# Patient Record
Sex: Male | Born: 1935 | Race: Black or African American | Hispanic: No | Marital: Single
Health system: Southern US, Community
[De-identification: ages and names within clinical notes are randomized; demographics above are authoritative.]

## PROBLEM LIST (undated history)

## (undated) DIAGNOSIS — M1990 Unspecified osteoarthritis, unspecified site: Secondary | ICD-10-CM

## (undated) DIAGNOSIS — I442 Atrioventricular block, complete: Secondary | ICD-10-CM

## (undated) DIAGNOSIS — I10 Essential (primary) hypertension: Secondary | ICD-10-CM

## (undated) DIAGNOSIS — I5032 Chronic diastolic (congestive) heart failure: Secondary | ICD-10-CM

## (undated) DIAGNOSIS — I259 Chronic ischemic heart disease, unspecified: Secondary | ICD-10-CM

## (undated) DIAGNOSIS — C61 Malignant neoplasm of prostate: Secondary | ICD-10-CM

## (undated) DIAGNOSIS — H547 Unspecified visual loss: Secondary | ICD-10-CM

## (undated) DIAGNOSIS — E119 Type 2 diabetes mellitus without complications: Secondary | ICD-10-CM

## (undated) HISTORY — DX: Chronic diastolic (congestive) heart failure: I50.32

## (undated) HISTORY — DX: Chronic ischemic heart disease, unspecified: I25.9

## (undated) HISTORY — DX: Unspecified visual loss: H54.7

## (undated) HISTORY — DX: Malignant neoplasm of prostate: C61

## (undated) HISTORY — DX: Atrioventricular block, complete: I44.2

## (undated) HISTORY — DX: Type 2 diabetes mellitus without complications: E11.9

## (undated) HISTORY — DX: Unspecified osteoarthritis, unspecified site: M19.90

## (undated) HISTORY — DX: Essential (primary) hypertension: I10

---

## 2000-03-18 ENCOUNTER — Encounter: Payer: Self-pay | Admitting: Emergency Medicine

## 2000-03-18 ENCOUNTER — Inpatient Hospital Stay (HOSPITAL_COMMUNITY): Admission: EM | Admit: 2000-03-18 | Discharge: 2000-03-22 | Payer: Self-pay | Admitting: Emergency Medicine

## 2000-03-18 ENCOUNTER — Encounter (INDEPENDENT_AMBULATORY_CARE_PROVIDER_SITE_OTHER): Payer: Self-pay | Admitting: *Deleted

## 2000-03-19 ENCOUNTER — Encounter: Payer: Self-pay | Admitting: Internal Medicine

## 2000-03-21 ENCOUNTER — Encounter: Payer: Self-pay | Admitting: Internal Medicine

## 2000-03-26 ENCOUNTER — Encounter: Payer: Self-pay | Admitting: Emergency Medicine

## 2000-03-26 ENCOUNTER — Emergency Department (HOSPITAL_COMMUNITY): Admission: EM | Admit: 2000-03-26 | Discharge: 2000-03-26 | Payer: Self-pay | Admitting: *Deleted

## 2000-11-27 ENCOUNTER — Emergency Department (HOSPITAL_COMMUNITY): Admission: EM | Admit: 2000-11-27 | Discharge: 2000-11-27 | Payer: Self-pay | Admitting: Emergency Medicine

## 2003-03-30 ENCOUNTER — Inpatient Hospital Stay (HOSPITAL_COMMUNITY): Admission: EM | Admit: 2003-03-30 | Discharge: 2003-04-15 | Payer: Self-pay | Admitting: Emergency Medicine

## 2003-03-30 ENCOUNTER — Encounter: Payer: Self-pay | Admitting: Emergency Medicine

## 2004-05-24 HISTORY — PX: PACEMAKER INSERTION: SHX728

## 2004-06-17 ENCOUNTER — Inpatient Hospital Stay (HOSPITAL_COMMUNITY): Admission: AD | Admit: 2004-06-17 | Discharge: 2004-06-19 | Payer: Self-pay | Admitting: Cardiology

## 2004-06-18 ENCOUNTER — Encounter: Payer: Self-pay | Admitting: Cardiology

## 2004-10-25 ENCOUNTER — Ambulatory Visit: Payer: Self-pay | Admitting: Internal Medicine

## 2005-09-10 ENCOUNTER — Ambulatory Visit: Payer: Self-pay | Admitting: Internal Medicine

## 2006-01-07 IMAGING — CR DG CHEST 2V
2 series · 2 of 2 positions shown · non-contrast
Comparison: 06/18/04.

CLINICAL DATA: Complete heart block ? post pacer insertion. 
 TWO VIEW CHEST

[view not recorded (1 of 2)]
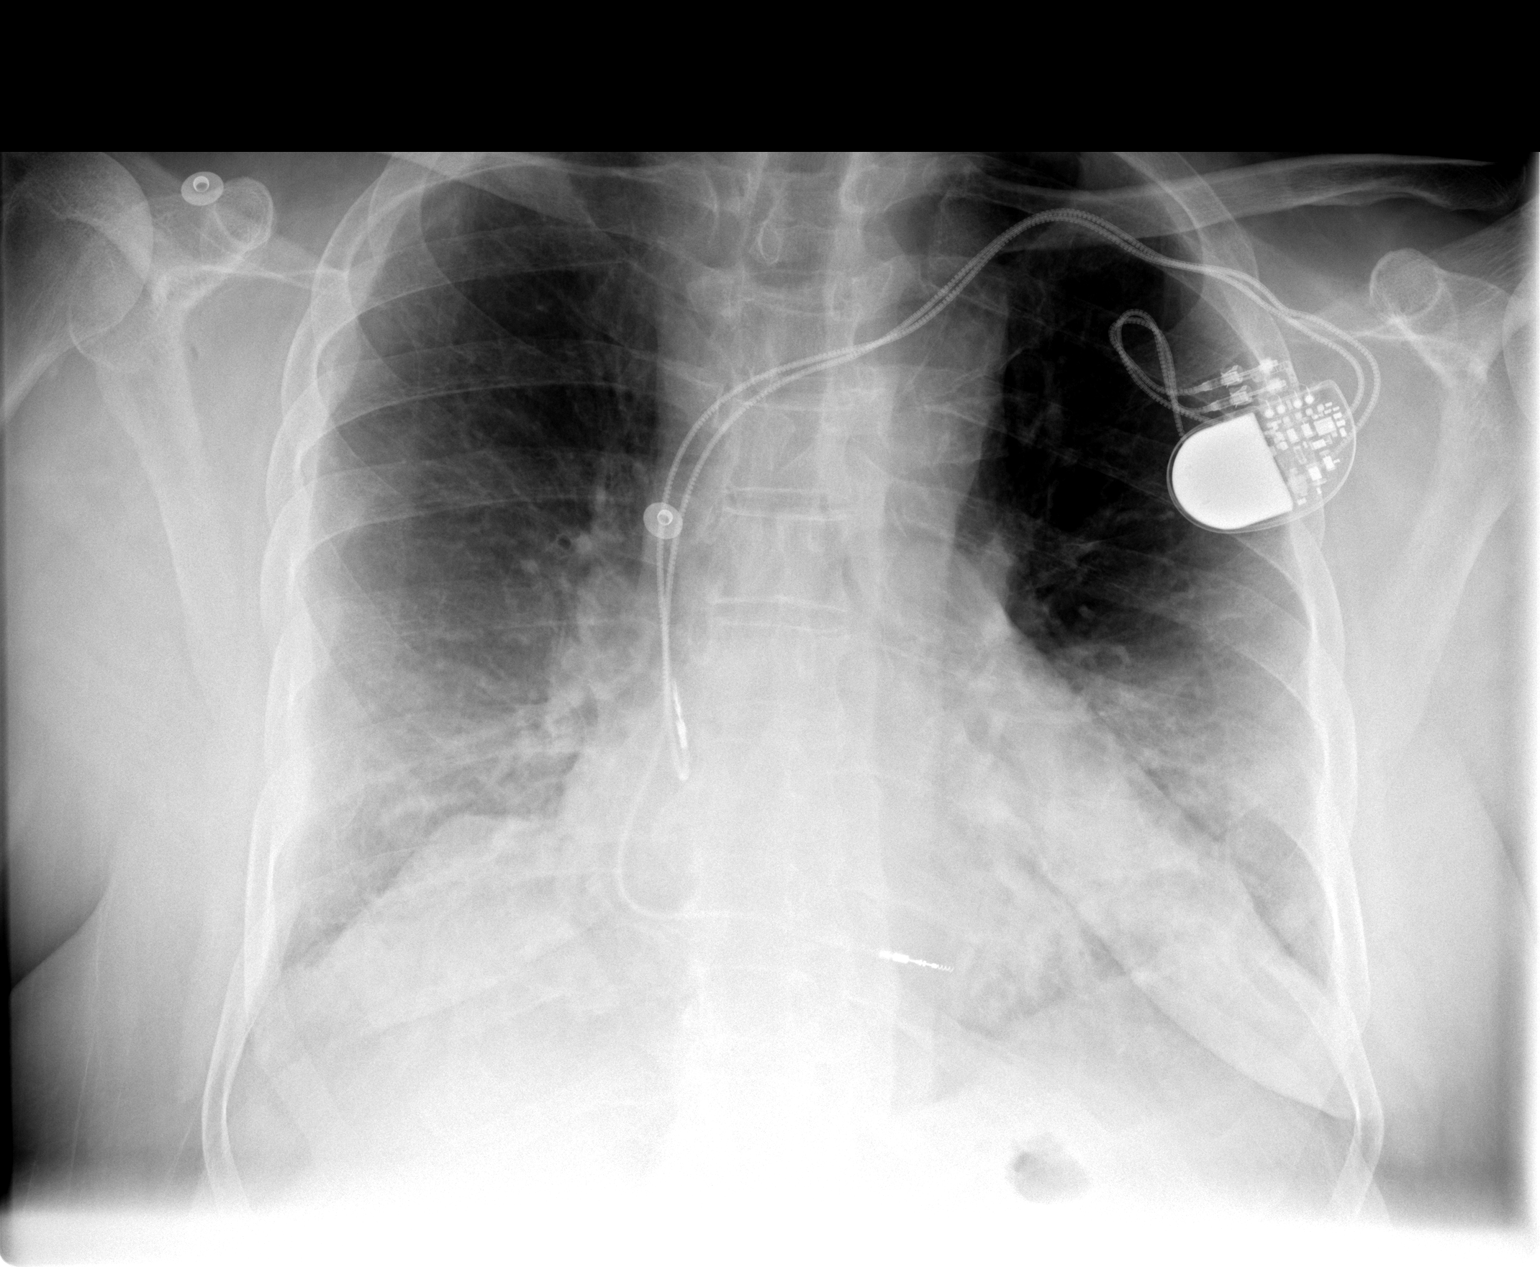

[view not recorded (2 of 2)]
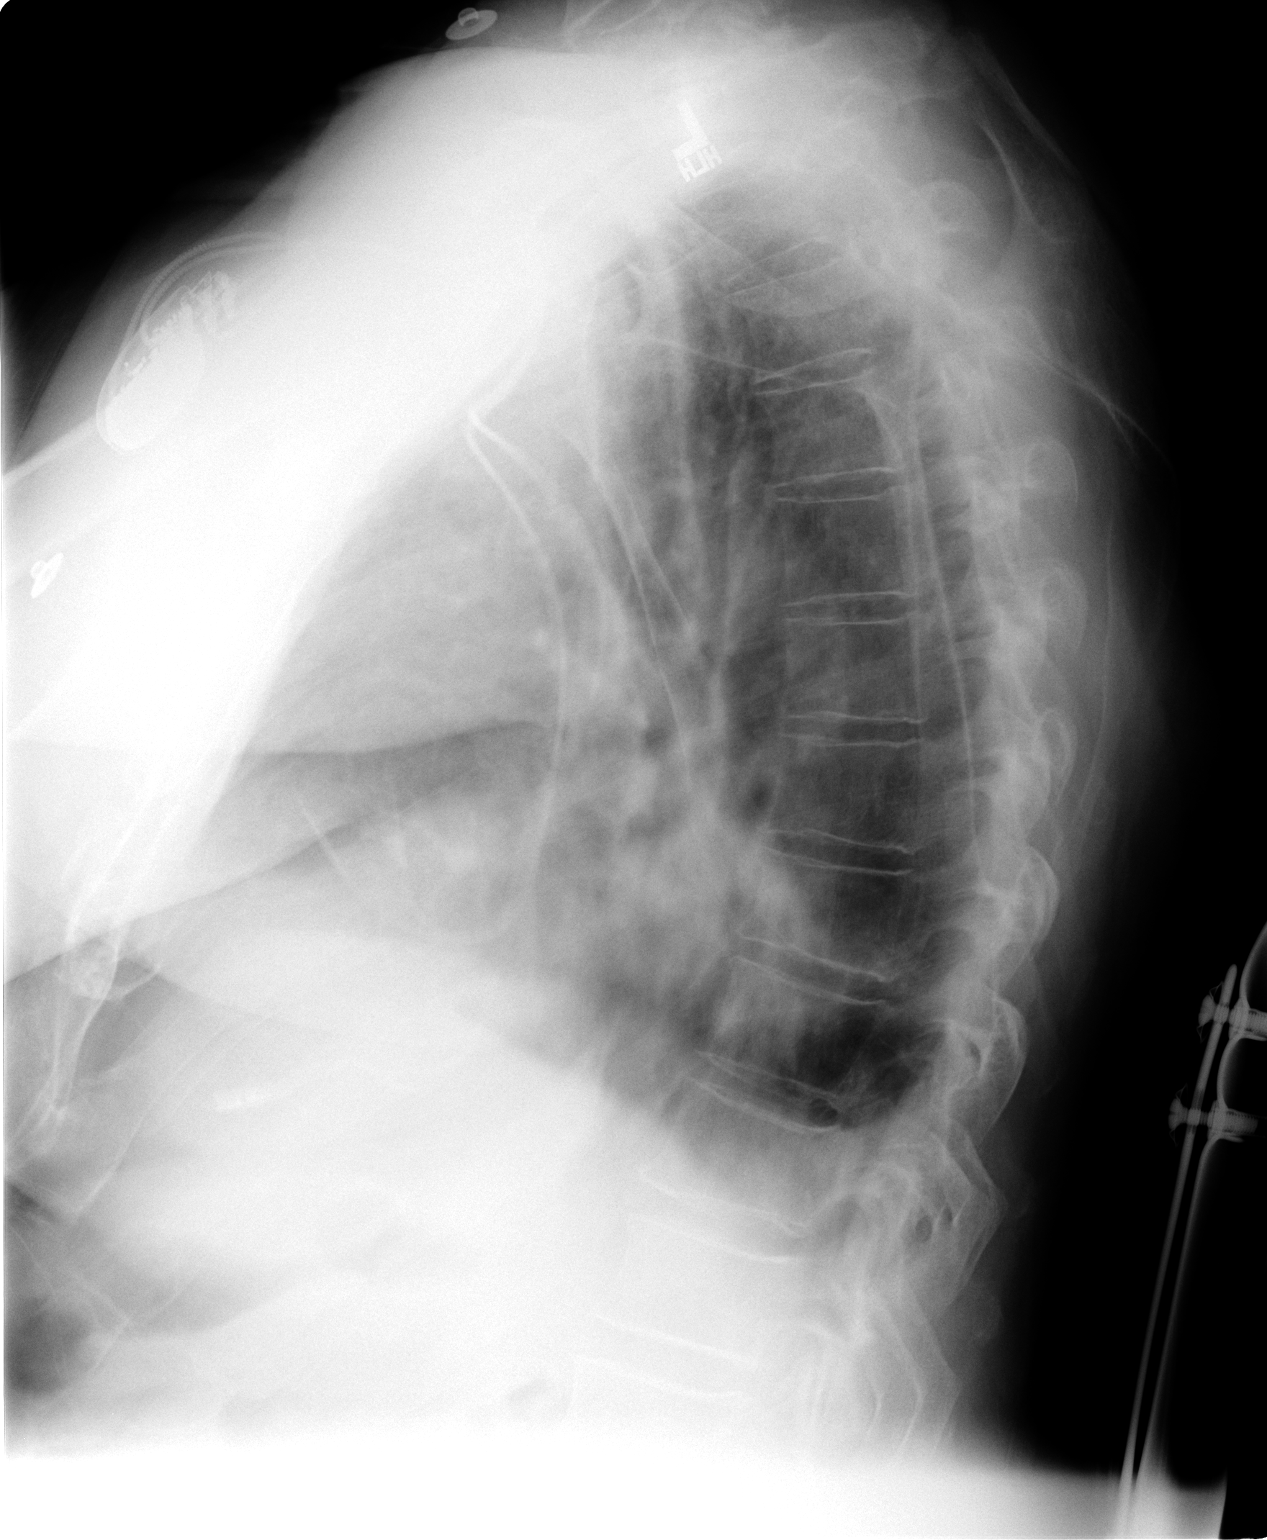

[2 of 2 positions shown; findings below may reference images not displayed]

Status-post placement of a dual lead permanent cardiac pacer.  There is marked breathing motion artifact on the lateral view.  No pneumothorax or heart failure. 
 IMPRESSION
 Status-post placement of dual lead pacer.  No pneumothorax. No definite active process ? breathing motion artifact on the left.

## 2006-04-10 ENCOUNTER — Ambulatory Visit: Payer: Self-pay | Admitting: Internal Medicine

## 2006-11-06 ENCOUNTER — Ambulatory Visit: Payer: Self-pay | Admitting: Internal Medicine

## 2007-04-28 ENCOUNTER — Ambulatory Visit: Payer: Self-pay | Admitting: Internal Medicine

## 2007-11-05 ENCOUNTER — Ambulatory Visit: Payer: Self-pay | Admitting: Internal Medicine

## 2008-05-24 ENCOUNTER — Ambulatory Visit: Payer: Self-pay | Admitting: Internal Medicine

## 2008-11-02 ENCOUNTER — Encounter: Payer: Self-pay | Admitting: Internal Medicine

## 2008-11-21 ENCOUNTER — Ambulatory Visit: Payer: Self-pay | Admitting: Internal Medicine

## 2009-07-11 ENCOUNTER — Ambulatory Visit: Payer: Self-pay | Admitting: Internal Medicine

## 2009-07-11 DIAGNOSIS — I10 Essential (primary) hypertension: Secondary | ICD-10-CM

## 2009-07-11 DIAGNOSIS — Z950 Presence of cardiac pacemaker: Secondary | ICD-10-CM | POA: Insufficient documentation

## 2010-03-05 ENCOUNTER — Encounter: Payer: Self-pay | Admitting: Internal Medicine

## 2010-03-05 ENCOUNTER — Ambulatory Visit: Payer: Self-pay

## 2010-09-25 ENCOUNTER — Ambulatory Visit: Admit: 2010-09-25 | Payer: Self-pay | Admitting: Internal Medicine

## 2010-10-23 NOTE — Cardiovascular Report (Signed)
Summary: Office Visit   Office Visit   Imported By: Roderic Ovens 03/22/2010 12:43:04  _____________________________________________________________________  External Attachment:    Type:   Image     Comment:   External Document

## 2010-10-23 NOTE — Miscellaneous (Signed)
Summary: [facility] Admission Record  [facility] Admission Record   Imported By: Roderic Ovens 10/04/2009 12:35:46  _____________________________________________________________________  External Attachment:    Type:   Image     Comment:   External Document

## 2010-10-23 NOTE — Assessment & Plan Note (Signed)
Summary: pacer check/medtronic   Visit Type:  Pacemaker check Primary Provider:  pt in [facility] rehab center   History of Present Illness: Mr. Tricarico returns today for followup.  He is an elderly, demented man with a h/o symptomatic bradycardia, CHB, and HTN.  He is s/p PPM implant.  He denies c/p or sob.  He has mild peripheral edema.  Current Medications (verified): 1)  Colace 100 Mg Caps (Docusate Sodium) .Marland Kitchen.. 1 Once Daily 2)  Hydroxyzine Hcl 25 Mg Tabs (Hydroxyzine Hcl) .Marland Kitchen.. 1 Tab Q 6 Hours As Needes For Itching 3)  Lisinopril 2.5 Mg Tabs (Lisinopril) .... Take One Tablet By Mouth Daily 4)  Nitrostat 0.4 Mg Subl (Nitroglycerin) .Marland Kitchen.. 1 Tablet Under Tongue At Onset of Chest Pain; You May Repeat Every 5 Minutes For Up To 3 Doses. 5)  Oystershell Calicum .... 2 Tabs Two Times A Day 6)  Phenobarbital 60 Mg Tabs (Phenobarbital) .Marland Kitchen.. 1 Tab Two Times A Day 7)  Tandem Plus .Marland KitchenMarland Kitchen. 1 Capsule Two Times A Day 8)  Therapeutic-M .... 1 Tab Once Daily 9)  Eucerin Lotion .... As Needed 10)  Alprazolam 0.5 Mg Tabs (Alprazolam) .Marland Kitchen.. 1 Tab Q 6 Hours As Directed 11)  Mirtazapine 15 Mg Tabs (Mirtazapine) .Marland Kitchen.. 1 Tab Qhs 12)  Metformin Hcl 500 Mg Tabs (Metformin Hcl) .Marland Kitchen.. 1 Tab Once Daily 13)  Genebes (Apap) 350mg  .... 2 Tabs As Needed For Pain 14)  Oxycodone-Acetaminophen 5-325 Mg Tabs (Oxycodone-Acetaminophen) .Marland Kitchen.. 1 Tab Q 6 Hours As Needed For Pain 15)  Aspirin 81 Mg Tbec (Aspirin) .... Take One Tablet By Mouth Daily 16)  Bicalutamide 50 Mg Tabs (Bicalutamide) .Marland Kitchen.. 1 Tab Once Daily  Allergies (verified): No Known Drug Allergies  Past History:  Past Medical History: Last updated: 10/21/2008 Complete heart block Diastolic CHF  HTN  Past Surgical History: Last updated: 10/21/2008 S/P PPM 05/2004  Vital Signs:  Patient profile:   77 year old male BP sitting:   117 / 70  (left arm) Cuff size:   large  Vitals Entered By: Burnett Kanaris, CNA (July 11, 2009 3:33 PM)  Physical  Exam  General:  Well developed, well nourished, in no acute distress.  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 Neck: supple. No JVD. Carotids 2+ bilaterally no bruits Cor: RRR no rubs, gallops or murmur Lungs: CTA Ab: soft, nontender. nondistended. No HSM. Good bowel sounds Ext: warm. no cyanosis, clubbing. 1+ edema Neuro: alert and oriented. Grossly nonfocal. affect pleasant    PPM Specifications Following MD:  Lewayne Bunting, MD     PPM Vendor:  Medtronic     PPM Model Number:  303B     PPM Serial Number:  [id number] PPM DOI:  06/18/2004      Lead 1    Location: RA     DOI: 06/18/2004     Model #: [id number]     Serial #: [id number]     Status: active Lead 2    Location: RV     DOI: 06/18/2004     Model #: [id number]     Serial #: [id number]     Status: active  Magnet Response Rate:  BOL 85 ERI  65  Indications:  CHB  Explantation Comments:  Pacemaker dependent  PPM Follow Up Remote Check?  No Battery Voltage:  2.78 V     Battery Est. Longevity:  5 years     Pacer Dependent:  Yes       PPM Device Measurements Atrium  Amplitude: 2.8 mV, Impedance: 468 ohms, Threshold: 0.5 V at 0.4 msec Right Ventricle  Impedance: 456 ohms, Threshold: 0.5 V at 0.4 msec  Episodes MS Episodes:  31     Percent Mode Switch:  0%     Ventricular High Rate:  6     Atrial Pacing:  0.3%     Ventricular Pacing:  99.9%  Parameters Mode:  DDD     Lower Rate Limit:  60     Upper Rate Limit:  120 Paced AV Delay:  150     Sensed AV Delay:  120 Next Cardiology Appt Due:  12/22/2009 Tech Comments:  No  parameter changes.  Device function normal.   ROV 6 months clinic. Altha Harm, LPN  July 11, 2009 3:52 PM  MD Comments:  Agree with above.  Impression & Recommendations:  Problem # 1:  CARDIAC PACEMAKER IN SITU (ICD-V45.01) His device is working normally.  Will recheck in one year.  Problem # 2:  ESSENTIAL HYPERTENSION, BENIGN (ICD-401.1) His blood pressure is reasonably well controlled. His updated medication  list for this problem includes:    Lisinopril 2.5 Mg Tabs (Lisinopril) .Marland Kitchen... Take one tablet by mouth daily    Aspirin 81 Mg Tbec (Aspirin) .Marland Kitchen... Take one tablet by mouth daily  Patient Instructions: 1)  Your physician recommends that you schedule a follow-up appointment in: 6 months with device clinic and 12 months with Dr Ladona Ridgel

## 2010-10-23 NOTE — Procedures (Signed)
Summary: rov/jml   Current Medications (verified): 1)  Colace 100 Mg Caps (Docusate Sodium) .Marland Kitchen.. 1 Once Daily 2)  Hydroxyzine Hcl 25 Mg Tabs (Hydroxyzine Hcl) .Marland Kitchen.. 1 Tab Q 6 Hours As Needes For Itching 3)  Lisinopril 2.5 Mg Tabs (Lisinopril) .... Take One Tablet By Mouth Daily 4)  Nitrostat 0.4 Mg Subl (Nitroglycerin) .Marland Kitchen.. 1 Tablet Under Tongue At Onset of Chest Pain; You May Repeat Every 5 Minutes For Up To 3 Doses. 5)  Oystershell Calicum .... 2 Tabs Two Times A Day 6)  Phenobarbital 60 Mg Tabs (Phenobarbital) .Marland Kitchen.. 1 Tab Two Times A Day 7)  Tandem Plus .Marland KitchenMarland Kitchen. 1 Capsule Two Times A Day 8)  Therapeutic-M .... 1 Tab Once Daily 9)  Eucerin Lotion .... As Needed 10)  Alprazolam 0.5 Mg Tabs (Alprazolam) .Marland Kitchen.. 1 Tab Q 6 Hours As Directed 11)  Mirtazapine 15 Mg Tabs (Mirtazapine) .... 1/2 By Mouth At Bedtime 12)  Metformin Hcl 500 Mg Tabs (Metformin Hcl) .Marland Kitchen.. 1 Tab Once Daily 13)  Genebes (Apap) 350mg  .... 2 Tabs As Needed For Pain 14)  Oxycodone-Acetaminophen 5-325 Mg Tabs (Oxycodone-Acetaminophen) .Marland Kitchen.. 1 Tab Q 6 Hours As Needed For Pain 15)  Aspirin 81 Mg Tbec (Aspirin) .... Take One Tablet By Mouth Daily 16)  Bicalutamide 50 Mg Tabs (Bicalutamide) .Marland Kitchen.. 1 Tab Once Daily  Allergies (verified): No Known Drug Allergies  PPM Specifications Following MD:  Lewayne Bunting, MD     PPM Vendor:  Medtronic     PPM Model Number:  303B     PPM Serial Number:  [id number] PPM DOI:  06/18/2004      Lead 1    Location: RA     DOI: 06/18/2004     Model #: [id number]     Serial #: [id number]     Status: active Lead 2    Location: RV     DOI: 06/18/2004     Model #: 8469     Serial #: [id number]     Status: active  Magnet Response Rate:  BOL 85 ERI  65  Indications:  CHB  Explantation Comments:  Pacemaker dependent  PPM Follow Up Remote Check?  No Battery Voltage:  2.77 V     Battery Est. Longevity:  4 years     Pacer Dependent:  Yes       PPM Device Measurements Atrium  Amplitude: 4.0 mV, Impedance: 471  ohms, Threshold: 1.0 V at 0.4 msec Right Ventricle  Impedance: 454 ohms, Threshold: 0.5 V at 0.4 msec  Episodes MS Episodes:  16     Percent Mode Switch:  0%     Coumadin:  No Ventricular High Rate:  4     Atrial Pacing:  1.9%     Ventricular Pacing:  100%  Parameters Mode:  DDD     Lower Rate Limit:  60     Upper Rate Limit:  120 Paced AV Delay:  150     Sensed AV Delay:  120 Tech Comments:  No parameter changes. Device function normal.  4 VHR episodes 2-3 seconds long.  The longest mode switch was 1:01 minutes, - coumadin.  ROV 6 months with Dr. Graciela Husbands in [place]. Altha Harm, LPN  March 05, 2010 12:27 PM

## 2010-11-16 ENCOUNTER — Encounter: Payer: Self-pay | Admitting: Internal Medicine

## 2010-11-16 ENCOUNTER — Encounter (INDEPENDENT_AMBULATORY_CARE_PROVIDER_SITE_OTHER): Payer: Medicare Other | Admitting: Internal Medicine

## 2010-11-16 DIAGNOSIS — I2589 Other forms of chronic ischemic heart disease: Secondary | ICD-10-CM

## 2010-11-16 DIAGNOSIS — Z950 Presence of cardiac pacemaker: Secondary | ICD-10-CM

## 2010-11-16 DIAGNOSIS — I10 Essential (primary) hypertension: Secondary | ICD-10-CM

## 2010-11-16 DIAGNOSIS — I442 Atrioventricular block, complete: Secondary | ICD-10-CM

## 2010-11-29 NOTE — Cardiovascular Report (Signed)
Summary: Office Visit   Office Visit   Imported By: Roderic Ovens 11/22/2010 15:58:42  _____________________________________________________________________  External Attachment:    Type:   Image     Comment:   External Document

## 2010-11-29 NOTE — Assessment & Plan Note (Signed)
Summary: pc2/glc/glc.Marland KitchenMarland KitchenPT [facility]LG   Allergies: No Known Drug Allergies   PPM Specifications Following MD:  Lewayne Bunting, MD     PPM Vendor:  Medtronic     PPM Model Number:  303B     PPM Serial Number:  [id number] H PPM DOI:  06/18/2004      Lead 1    Location: RA     DOI: 06/18/2004     Model #: 6962     Serial #: [id number]     Status: active Lead 2    Location: RV     DOI: 06/18/2004     Model #: 4010     Serial #: [id number]     Status: active  Magnet Response Rate:  BOL 85 ERI  65  Indications:  CHB  Explantation Comments:  Pacemaker dependent  PPM Follow Up Pacer Dependent:  Yes      Episodes Coumadin:  No  Parameters Mode:  DDD     Lower Rate Limit:  60     Upper Rate Limit:  120 Paced AV Delay:  150     Sensed AV Delay:  120

## 2010-12-14 NOTE — Assessment & Plan Note (Signed)
[facility]                       [place] CARDIOLOGY OFFICE NOTE  Andrew Quinn, Andrew Quinn                      MRN:          [id number] DATE:11/16/2010                            DOB:          1930-03-15   Andrew Quinn is seen in follow-up for pacemaker implanted for complete heart block by Dr. Ladona Ridgel. He has no complaints.  PAST MEDICAL HISTORY:  Past medical history is legion and includes a poorly clarified problem list describing the following. 1. Diabetes. 2. Hypertension. 3. Coronary artery disease. 4. Diastolic heart failure. 5. Prostate cancer. 6. Arthritis. 7. Blindness.  MEDICATIONS:  Pentobarbital, Remeron, metformin, lisinopril.  EXAMINATION:  VITAL SIGNS:  Blood pressure 139/86, pulse is 81. GENERAL:  He was in a wheelchair. LUNGS:  Clear. HEART:  Sounds are regular. EXTREMITIES:  With brawny edema.  Interrogation of his Medtronic Sigma pulse generator demonstrates a battery voltage of 2.77 with an estimated longevity of 3.5. Impedance: A-A was 47-V was 459, amplitude-A was greater than 2.8. HE IS DEPENDENT.  Heart rate excursion was much broader than I would anticipated for him being in a wheelchair.  I did not ask him whether he was ambulatory or not.  IMPRESSION: 1. Complete heart block. 2. Status post pacer for the above. 3. Ischemic heart disease. 4. Hypertension.  Andrew Quinn is relatively stable.  His device is functioning normally. He has no significant symptoms of ischemic heart disease.  His blood pressure is adequately controlled.  Will see him again in 6 months.   Duke Salvia, MD, [facility]   SCK/MedQ  DD: 11/16/2010  DT: 11/17/2010  Job #: [id number]

## 2011-01-12 ENCOUNTER — Encounter: Payer: Self-pay | Admitting: Internal Medicine

## 2011-02-05 NOTE — Assessment & Plan Note (Signed)
[facility]                         ELECTROPHYSIOLOGY OFFICE NOTE   Quinn, Andrew                      MRN:          [id number]  DATE:04/28/2007                            DOB:          01-Jan-1930    Andrew Quinn returns today for followup.  He is a very pleasant man with a  history of symptomatic complete heart block, status post pacemaker  insertion.  He has a history of ischemic heart disease with preserved LV  function and diabetes.  He returns today for followup.  He denies chest  pain.  He does have a very mild discomfort at his pacemaker insertion  site, otherwise he is stable.   PHYSICAL EXAMINATION:  He is a pleasant, well-appearing man in no  distress.  Blood pressure 120/68, pulse 75 and regular.  The respirations were 18.  The weight was 204 pounds.  NECK:  No jugular venous distention.  LUNGS:  Clear bilaterally to auscultation.  There are no wheezes, rales  or rhonchi.  CARDIOVASCULAR:  Regular rate and rhythm with a normal S1 and S2.  EXTREMITIES:  No edema.   MEDICATIONS:  1. Insulin as directed.  2. Multivitamins.  3. Lasix 20 a day.  4. Actonel 35 weekly.  5. Aspirin.  6. Iron.  7. Imdur 30 mg daily.  8. Lisinopril 10 mg daily.  9. Glucotrol 10 mg daily.  10.Glucophage 1000 mg twice daily.   Interrogation of his pacemaker demonstrates a Medtronic Sigma with P  waves greater than 2.  There are no R waves secondary to complete heart  block.  The impedance is 505 in the atrium, 473 in the ventricle.  Threshold is 0.5 at 0.4 in both the atrium and the right ventricle.  Battery voltage was 2.78 volts.  The underlying rhythm is complete heart  block.   IMPRESSION:  1. Complete heart block.  2. Symptomatic bradycardia.  3. Hypertension.  4. Status post pacemaker insertion.   DISCUSSION:  Overall, [name] is stable, and his pacemaker is working  normally.  We will plan to see him back in the office in a year for  pacemaker followup.     Doylene Canning. Ladona Ridgel, MD  Electronically Signed    GWT/MedQ  DD: 04/28/2007  DT: 04/28/2007  Job #: [id number]   cc:   [facility]

## 2011-02-05 NOTE — Assessment & Plan Note (Signed)
[facility]                         ELECTROPHYSIOLOGY OFFICE NOTE   Andrew Quinn, Andrew Quinn                      MRN:          [id number]  DATE:05/24/2008                            DOB:          1930-08-05    Andrew Quinn returns today for followup.  He is a very pleasant elderly  male with a history of complete heart block, status post pacemaker  insertion.  He also has ischemic heart disease with preserved LV  function and diabetes.  He notes that he has some very atypical chest  discomfort, when he reaches up and grabs something up high, otherwise no  specific complaints today.  He denies chest pain or shortness of breath.   MEDICATIONS:  1. Os-Cal.  2. Insulin as directed.  3. Multiple vitamins.  4. Actonel 35 once a week.  5. Iron supplements.  6. Phenobarbital 3 tablets twice a day.  7. Lisinopril 2.5 mg daily.  8. Metformin 500 mg twice daily.  9. Aspirin 81 mg daily.   PHYSICAL EXAMINATION:  GENERAL:  He is a pleasant elderly man in no  acute distress.  VITAL SIGNS:  The blood pressure today was 114/67.  The pulse was 82 and  regular.  The respirations were 18.  NECK:  Revealed no jugular distention.  LUNGS:  Clear bilaterally to auscultation.  No wheezes, rales, or  rhonchi are present.  CARDIOVASCULAR:  Regular rate and rhythm.  Normal S1 and S2.  EXTREMITIES:  Demonstrated no peripheral edema.   Interrogation of his pacemaker demonstrates Medtronic Sigma.  P-waves  are greater than 2.  R-waves are greater than 11.  The impedance 474 in  the A and 448 in the RV.  The threshold of 1 volt at 0.4 in the atrium  0.5 volt at 0.4 in the RV.  The battery voltage was 2.77 volts.  The  patient had 13 mode switches, the longest of which was 7 minutes.   IMPRESSION:  1. Symptomatic bradycardia.  2. Complete heart block.  3. Status post pacemaker insertion  4. Diabetes.   DISCUSSION:  Overall, Andrew Quinn is stable.  His pacemaker is  working  normally.  We will plan to see him back for pacemaker check in 1 year.     Doylene Canning. Ladona Ridgel, MD  Electronically Signed    GWT/MedQ  DD: 05/24/2008  DT: 05/25/2008  Job #: [id number]

## 2011-02-08 NOTE — Discharge Summary (Signed)
[name], Andrew Quinn NO.:  [id number]   MEDICAL RECORD NO.:  [id number]          PATIENT TYPE:  INP   LOCATION:  2931                         FACILITY:  [facility]   PHYSICIAN:  Doylene Canning. Ladona Ridgel, M.D.  DATE OF BIRTH:  [birth year]   DATE OF ADMISSION:  06/17/2004  DATE OF DISCHARGE:  06/19/2004                                 DISCHARGE SUMMARY   ADMISSION DIAGNOSES:  1.  Admitted with episode of syncope/congestive heart failure/found to be in      third degree complete heart block.  2.  Postoperative day #1 after implantation of Medtronic dual-chamber      pacemaker, that is a DDD pacemaker, Medtronic Sigma SDR 303B, Serial      Number [id number].  Dr. Lewayne Bunting on September 26.  3.  A two-dimensional echocardiogram was done June 18, 2004.  This      study showed ejection fraction of 55 to 65%.  No valvular abnormalities.   SECONDARY DIAGNOSES:  1.  Diabetes x 15 years.  2.  Hypertension  x 15 years.  3.  History of prostate cancer.  4.  Epilepsy.  5.  Legally blind.   PROCEDURES:  As mentioned above.  1.  Implantation of Medtronic DDD pacemaker.  2.  A 2-D echocardiogram.   Of note, the patient was admitted with a BNP of 1460, requiring oxygen  supplementation of 4 liters to achieve 97%.  On discharge day, September 27,  BNP is 354.  He is 97% on room air.  In the interim, the patient has been  aggressively diuresed with IV Lasix with aggressive potassium  supplementation.  Pacemaker was interrogated postprocedure day #1, all  values within normal limits, no changes made.  The patient is A sensing, V  pacing at a rate of 60 to 70 beats per minute.  He has had no further  syncope this admission.  Mobility issues for this patient have been  discussed including the importance of keeping left arm quietly by his side  for the next four days and keeping incision dry for the next week.  He will  have followup for his pacemaker at [facility], [address] on Monday,  July 02, 2004, at 9:45 in the  morning.  At that time, the incision will be inspected and the pacemaker  interrogated.  He will see Dr. Ladona Ridgel in followup at [facility] on  October 10, 2004, at 9:50 in the morning.  It is recommended if this patient  does not have a cardiologist in [place] as yet,  he follow up with [facility], either Dr. Tomie China or Dr. Sherril Croon or Dr. Sherlyn Lick.  [name] will  also benefit from a basic metabolic panel to be taken in one week, maybe  Tuesday, October 2, just to check to make sure  his potassium level is maintaining.  Of note, [name]' incision is  healing nicely.  Also of note, [name] was ruled out for myocardial  infarction on admission.  His lipid profile reads as follows, cholesterol  157, triglycerides 94, HDL cholesterol 55, LDL cholesterol 83.       GM/MEDQ  D:  06/19/2004  T:  06/19/2004  Job:  [id number]   cc:   Doylene Canning. Ladona Ridgel, M.D.   [facility]  [place], [place]

## 2011-02-08 NOTE — Discharge Summary (Signed)
[place]  Patient:    Andrew Quinn, Andrew Quinn                    MRN: [id number] Adm. Date:  [id number] Disc. Date: 03/22/00 Attending:  Madaline Guthrie Dictator:   Leory Plowman, M.D. CC:         Madaline Guthrie, M.D.             Leory Plowman, M.D.             Cheri Rous, M.D. in [place] of [facility]             Penne Lash, M.D., urologist in [place]                           Discharge Summary  DISCHARGE DIAGNOSES: 1. Chest pain, ruled out for myocardial infarction. 2. Bright red blood per rectum, hemoglobin stable. 3. Type 2 diabetes. 4. Prostatic enlargement with prostate-specific antigen 47. 5. Hypertension. 6. Seizure disorder. 7. Legally blind. 8. Low free T4.  DISCHARGE MEDICATIONS: 1. Dilantin 100 mg three tablets p.o. q.h.s. and two tablets p.o. q.a.m. 2. Zestril 10 mg p.o. q.d. 3. Multivitamin one p.o. q.d. 4. Glucotrol XL 10 mg p.o. q.d. 5. Phenobarbital 30 mg p.o. b.i.d. 6. Colace 100 mg p.o. q.d. 7. Cipro 250 mg one tablet p.o. b.i.d. times five days only.  FOLLOW-UP:  The patient will be scheduled to follow up in the outpatient clinic in one week at which time the results of his prostate biopsy will be reviewed.  Further follow up will be with his regular medical doctor, Cheri Rous, M.D. of [facility].  Follow-up needs will include colonoscopy to rule out serious causes of his rectal bleeding and a thyroid function panel to confirm if his marginally low free T4 is significant.  PROCEDURES: 1. On March 19, 2000, a Cardiolite was performed which was negative for    ischemia. 2. On March 21, 2000, a bone scan was performed which was negative for evidence    of metastasis. 3. On March 21, 2000, a prostate biopsy was performed.  The results of which    are pending at the time of discharge. 4. On March 18, 2000, a CT scan of the abdomen was performed which did not show    any evidence  of abdominal aortic aneurysm.  However, it did show an    enlarged prostate.  CONSULTATIONS:  Dr. Meade Maw was consulted of cardiology.  Urology was also consulted.  HISTORY AND PHYSICAL:  Please see full admission history and physical for details.  Briefly, the patient is a 77 year old, blind, gentleman with a past medical history of diabetes and hypertension who presented to the [facility] Emergency Department after weakness and substernal chest pain which lasted about 15 minutes.  His EKG at that time showed a right bundle branch block and first degree AV block but no evidence of ST changes, Q-waves, or T-wave inversion.  His cardiac enzymes on admission were normal.  A physical exam was significant for hematochezia.  DIAGNOSTIC STUDIES:  On admission, a hemoglobin was 13.6, Dilantin level 16.4, and a phenobarbital level of 12.8, a CK of 144 with an MB of 3.8, and a troponin-I of 0.03.  HOSPITAL COURSE: #1 - The patient was admitted to rule out myocardial infarction. Serial enzymes and EKGs showed no evidence of ischemia.  He was observed on telemetry without abnormalities.  Cardiology  was consulted, and a Cardiolite was performed which showed no evidence of ischemic change.  A fasting lipid panel was performed which revealed a total cholesterol of 183, HDL of 42, and an LDL of 108.  No changes in his medical regimen were made.  However, aspirin was discontinued due to his rectal bleeding.  #2 - BRIGHT RED BLOOD PER RECTUM:  This quickly resolved after admission, and his hemoglobin remained stable.  On the day prior to discharge, his hemoglobin was 13.3.  Most likely the bleeding is due to internal hemorrhoids.  However, the patient needs an outpatient colonoscopy to rule out serious causes of GI bleeding.  For this time, he will remain off aspirin.  #3 - ENLARGED PROSTATE ON CT SCAN:  When this was found incidentally, a PSA was drawn which was found to be 48.5.  Urology  was consulted who recommended a prostatic biopsy.  A prostate biopsy was performed and is pending at the time of discharge.  A bone scan was performed which showed no evidence of bony metastasis.  The patient will return to clinic here at [facility] to follow up on the results of the prostate biopsy and then to continue to receive medical care from his physician in [place], Dr. Egbert Garibaldi.  #4 - HYPERTENSION:  The patients blood pressure was well-controlled during his hospital stay.  #5 - LOW FREE T4.  A TSH was performed which revealed a value of 1.43, and a free T4 was 0.83 which is only marginally low with a TSH within normal limits.  Repeat thyroid function panel as an outpatient would be appropriate. If he is persistently slightly hypothyroid, low dose replacement may be appropriate.  #6 - DIABETES TYPE 2:  The patient had good control of his blood sugar throughout hospitalization.  #7 - SEIZURE DISORDER:  The patient remained without seizure activity on his outpatient regimen of antiepileptic medications.  DISCHARGE LABORATORY DATA:  Hemoglobin was 13.3, white blood cell count 6.8, sodium 138, potassium 4.1, chloride 101, bicarbonate 30, BUN 13, creatinine 0.8, and glucose 101.  DISPOSITION:  The patient is discharged to the group home from which he came in [place], [place].  CONDITION ON DISCHARGE:  Stable and improved. DD:  03/22/00 TD:  03/22/00 Job: [id number]

## 2011-02-08 NOTE — Discharge Summary (Signed)
NAME:  IZEKIEL, FLEGEL                       ACCOUNT NO.:  [id number]   MEDICAL RECORD NO.:  [id number]                   PATIENT TYPE:  INP   LOCATION:  5715                                 FACILITY:  [facility]   PHYSICIAN:  Thomes Lolling, M.D.                 DATE OF BIRTH:  07-Jul-1930   DATE OF ADMISSION:  03/30/2003  DATE OF DISCHARGE:  04/14/2003                                 DISCHARGE SUMMARY   DISCHARGE DIAGNOSES:  1. Skin rash.  2. Anemia.  3. Diabetes mellitus, type 2.  4. Hypertension.  5. Epilepsy.  6. Prostate cancer.   DISCHARGE MEDICATIONS:  1. NPH insulin 4 units subcu b.i.d. a.c.  This should be tapered down as the     patient is tapered off steroids.  2. Insulin regular 1-10 units subcu t.i.d.  This should be tapered down as     the patient is tapered off steroids.  3. Phenobarbital 100 mg p.o. at bedtime.  4. Phenobarbital 30 mg p.o. daily.  5. Prednisone 80 mg p.o. daily.  6. Senokot-S, three tablets p.o. b.i.d.  7. Triamcinolone cream, one topical application to skin lesions t.i.d.  8. Dulcolax 5 mg suppository per rectum p.r.n. for constipation.  9. Benadryl 25 mg p.o. q.6 h. p.r.n. for itching.   DISPOSITION:  The patient will be transferred to [facility] in [place].  Followup appointments will be made for Mr. Genis 77 to see a primary care physician and urologist in [place], unless  arrangements can be made to see his former primary care physician and  urologist.  This information will be dictated as an addendum.  At his  primary care followup appointment, he will need a CBC with differential, a  BMP, a PSA, and phenobarbital level.  At his next urology appointment, the  patient should be scheduled for his next dose of Lupron and Casodex for  treatment of his prostate cancer.  In addition, he will need to have a bone  scan in 2-3 months.  Mr. Campanelli' primary care physician should taper his  insulin regimen as his  steroid dose is tapered to ensure that hypoglycemia  is avoided.   PROCEDURES:  1. Chest x-ray, AP and lateral, on March 30, 2003.  Impression:  Stable     borderline cardiomegaly.  No acute abnormality.  2. Dermatology consultation on April 04, 2003.  Impression:  Drug rash     consistent with drug hypersensitivity versus bullous pemphigoid; former     favored.  Bullous pemphigoid has tense blisters with negative Nikolsky's     sign, which is the case here.  Also, eosinophils are in the blood and     biopsy common in bullous pemphigoid; however, bullous pemphigoid usually     does not have background of erythema and hyperpigmentation.  Drug     reaction more often associated with widespread diffuse epidermal injury.  This presentation would be characteristic of bullous erythema multiforme,     Stevens-Johnson likely secondary to Dilantin or hyperglycemia.     Recommendations:  Would not use antibiotics until constitutional signs of     infection in face of likely drug rash.  Do not think that the patient is     currently infected.  Will use moderate to high dose steroids, such as 80-     100 mg of prednisone to start.  Could then taper the steroids if     responds.  Would also use triamcinolone cream 0.1% applied t.i.d.  The     patient may bathe with gentle soap and water.   HISTORY OF PRESENT ILLNESS:  Mr. Pipkins is a 77 year old African American  male with a past medical history significant for prostate cancer,  hypertension, and diabetes, who presents with rash and decreased energy.  He  states that in June he began to itch on the head, then the trunk and arms.  Shortly thereafter, rash broke out.  He was treated with prednisone and  Clarinex as an outpatient without improvement.  He was admitted to [facility] on March 11, 2003, where he was treated with IV nafcillin and  discharged on March 16, 2003.  A skin biopsy was obtained by a Dermatology  consultation on March 15, 2003, and the results were pending on discharge.  At discharge, blood cultures were negative, RPR was nonreactive, ANA was  negative, urine cultures were negative, and CT and chest x-ray were within  normal limits.  The etiology of the rash was unknown.   PAST MEDICAL HISTORY:  1. Diabetes mellitus, type 2 (15 years).  2. Hypertension (15 years).  3. Seizure disorder (40 years).  4. Prostate cancer.  5. Anemia.  6. Legally blind.   SOCIAL HISTORY:  The patient denies smoking, drinking, or other drug use.  He is single with a first grade education and has never been employed.  He  lived with his parents until they died and then lived at [facility].   FAMILY HISTORY:  His mother died at 58 years old.  His father died in his  17s.  He has nine siblings.   REVIEW OF SYSTEMS:  Significant for fever, dyspnea, edema, rash, itching,  cold intolerance, numbness of face and right hand, and depression.  He  denied chills, weight gain, chest pain, orthopnea, nausea, vomiting,  constipation, dysuria, joint pain, or joint swelling.   PHYSICAL EXAMINATION:  VITAL SIGNS:  Temperature 98.7 degrees, heart rate  101, blood pressure 146/52, respiratory rate 20.  GENERAL:  No acute distress.  HEENT:  Normocephalic.  Irregular pupils.  No discharge from nose.  Oropharynx without erythema or exudate.  NECK:  No cervical lymphadenopathy.  No JVD.  LUNGS:  Clear to auscultation bilaterally.  CARDIOVASCULAR:  The heart is regular rate and rhythm.  No murmurs, rubs, or  gallops.  ABDOMEN:  Soft and nontender.  Positive bowel sounds.  EXTREMITIES:  Ankle edema 2+.  SKIN:  Bullous lesions diffusely over body, more on limbs than trunk.  Lower  limb bullous lesions mainly on inner thighs.  Skin darker where patches of  lesions occur.  The skin on the trunk is thick with crust.  No lesions on  palms or soles of the feet.  LABORATORY DATA:  Comprehensive metabolic panel:  Sodium 137, potassium  4.0,  chloride 106, CO2 25, glucose 96, BUN 13, creatinine 0.8.  Total bilirubin  0.4, alkaline phosphatase 62, SGOT 16, SGPT 20, total protein 5.9, albumin  2.4, calcium 8.1.  CBC with differential:  WBC 17, RBC 3.13,  hemoglobin  9.5, hematocrit 28.1, MCV 89.7, MCHC 34.0, RDW 15.5, platelet count 405,  neutrophils 30%, lymphocytes 12%, monocytes 5%, eosinophils 53%, basophils  0%, absolute neutrophils 5.1, absolute lymphocytes 2.0, absolute monocytes  0.9, absolute eosinophils 9.0, and absolute basophils 0.0.  RPR morphology:  Basic stippling with slight hypochromia noted.  Dilantin level 7.5.  Phenobarbital level 11.9.  Urinalysis:  Color yellow, appearance clear,  specific gravity 1.022, pH 6.0, glucose negative, bilirubin negative,  ketones negative, blood negative, protein negative, urobilinogen 0.2,  nitrite negative, leukocytes negative.  Result comment:  Microscopic not  done on urines with negative protein, blood, leukocytes, nitrites, or  glucose less than 1000 mg/dL.  ESR (sedimentation rate) 28.  Urine culture:  Colony count no growth, culture no growth on one day.  Creatinine kinase  total 62.  Hemoglobin A1c 6.4.  PSA 8.02.   HOSPITAL COURSE:  Problem #1.  Rash:  The differential diagnosis included  pemphigoid bullous, __________-Sweet's syndrome, erythema multiforme,  allergic vasculitis, dermatomyositis, porphyria cutanea tarda, porphyria  impetigo, and infectious cause.  The rash was thought to be more than likely  a vasculitis versus drug reaction versus pemphigus reaction.  Given that the  patient's eosinophil level was significantly elevated at 53%, it was thought  that his rash could be related to his medications and thus be iatrogenic.  As such, many of his medicines were discontinued, including lisinopril,  Zyrtec, Glucotrol, Glucophage, iron sulfate, and Clarinex.  Phenobarbital  and Dilantin were not discontinued as the patient was known to have seizures  in the  past when changes were made to his seizure medications.  He was also  maintained on his Imdur for his hypertension.  This was continued because of  his cardiovascular disease.   Also, an old pathology from his prior hospital was obtained that indicated  that his rash was consistent with bullous pemphigoid.  There was also a  question as to whether or not his rash could be secondary to a malignancy  given his history of prostate cancer.  Given that we believed his rash to  possibly be secondary to drug reaction and given that Dilantin is known to  be associated with rashes, our plan was to increase his phenobarbital level  to a therapeutic dose and then discontinue the Dilantin.   At this time, because of the several possible etiologies for his rash,  including drug reaction, vasculitis, and bullous pemphigoid, a Dermatology  consultation was obtained.  The results of that consultation can be found in the prior section of this discharge summary concerning consultations.  In  short, the Dermatology consultation impression was that the rash was either  a bullous pemphigoid or drug rash and recommendations were made to start the  patient on moderate dose steroids and triamcinolone cream topically.  At  that time, the patient was started on prednisone 80 mg p.o. daily.  Despite  treatment with steroids and despite discontinuing many of the patient's  medicines, he continued to develop new lesions.  At that time, the  dermatologist was asked for additional recommendations and he suggested  methotrexate 5 mg p.o. x1 tablet each week x3 weeks, which is a treatment  regimen that is commonly used for the treatment of generalized bullous  pemphigoid, especially in elderly patients.  The patient was continued on  this regimen until  his phenobarbital therapy was found to be therapeutic at  20.3 mcg/mL.  At that time, his Dilantin was titrated down over time.   Initially, his Dilantin 200 mg q.a.m.  dose was discontinued, but his 300 mg  p.o. at bedtime dose was continued x2 days.  His at bedtime Dilantin dose  was then changed from 300 mg to 100 mg x2 days.  His Dilantin was then  finally discontinued.  The patient has subsequently convalesced in the  hospital for more than 24 hours and has been seizure-free.  It has been  noted that in the last week the patient has not developed any new lesions  and it appears that the development of his rash has stabilized.  The patient  should be maintained on prednisone 80 mg p.o. daily.  He should also receive  one last dose of 5 mg of methotrexate on April 21, 2003.  At that time, his  dose can be increased by 2.5 mg per week to a maximum dose of 12.5 mg a week  until he responds with decrease in his disease activity.  It should be  emphasized that at such time that his prednisone is tapered that his insulin  regimen should also be decreased according to avoid potential hypoglycemic  events.   Problem #2.  Anemia:  The patient presented with hemoglobin 9.5.  It was  thought that his anemia could be secondary to chronic illness versus  bleeding.  Stool guaiac was measured and was found to be negative.  In  addition, an iron profile and ferritin were measured.  His ferritin level  was found to be high at 581, ruling out iron-deficiency anemia.  His  hemoglobin subsequently stabilized in the high 9 to low 10 range.  The last  hemoglobin measured on April 14, 2003, was found to be 9.6, still in this  range.  It is possible that the patient's anemia could be secondary to his  primary illness.  However, it may be advisable at some point in the future  to perform a colonoscopy to look for occult gastrointestinal source of  bleeding.   Problem #3.  Diabetes mellitus:  The patient presented with well-controlled  diabetes mellitus with blood glucose 96 and a hemoglobin A1c 6.4 on  metformin and Glucotrol.  Because these drugs are known to be  associated with rash, they were discontinued.  At that time, the patient was put on a  sliding scale insulin regimen.  Despite this sliding scale insulin regimen,  the patient's blood glucose continued to rise into the 100s and 200s  subsequent to initiation of prednisone.  As such, he was started on insulin  NPH 5 units q.a.c. and at bedtime for long term baseline coverage.  Currently, we have tapered his NPH 4 units q.a.c. and at bedtime.  It should  again be emphasized that his insulin regimen should be tapered down as his  prednisone is discontinued to avoid hypoglycemia.  On this regimen, the  patient has been moderately well controlled with blood glucoses in the low  100 range.   Problem #4.  Hypertension:  The patient has been hemodynamically stable with  blood pressure within a reasonable range on Imdur.  His Imdur has been  discontinued recently and his blood pressure continues to be maintained at  an appropriate level.  His systolic blood pressure as of April 14, 2003,  ranged from 956-213 with a diastolic range of 08-65.  Upon resolution of his  rash,  a medical regimen for blood pressure control should be initiated to  more tightly control his hypertension.   Problem #5.  Epilepsy:  The patient was known to have a 40 year history of  epilepsy and seizures.  He was being well controlled on a regimen of  Dilantin and phenobarbital.  It was found that neither his phenobarbital nor  Dilantin were in therapeutic range upon admission to the hospital.  It was  decided that given the probability that Dilantin could be causing his rash,  that we would discontinue it once phenobarbital was raised into a  therapeutic range.  As such, his phenobarbital was increased to 100 mg p.o.  at bedtime and 30 mg p.o. daily until a therapeutic level of 20 was reached.  At that time, his Dilantin was tapered down and then finally discontinued.  He has been seizure-free for more than 24 hours.    Problem #6.  Prostate cancer:  On admission, the patient was found to have a  PSA level of 8.02.  I spoke with Dr. Alease Frame in [place], phone number  [phone], about Mr. Minjares' prostate cancer.  He said that the diagnosis was  made in March 2002.  His maximum PSA was 110.5.  Biopsy on December 13, 2002,  revealed a Gleason's scale 7 pathology that include perineural invasion.  Since that time, Mr. Lukas has been treated with Lupron and Casodex.  Since  initiation of his drug regimen, his PSAs have dropped from 110.5, then 11.4,  then 8.6, then 9.2, and then 8.2 on current admission.  Dr. Alease Frame says  that his next dose of Lupron and Casodex is due in September.  He also  stated that Mr. Lewter would need to have a bone scan in 2-3 months.   DISCHARGE LABORATORY DATA:  BMP:  Sodium 139, potassium 3.7, chloride 106,  CO2 27, glucose 122, BUN 14, creatinine 0.7, calcium 0.4.  CBC with  differential:  WBC 25.4, RBC 3.07, hemoglobin 9.6, hematocrit 27.9, MCV 90.7, MCHC 34.6, RDW 16.5, platelet count 518, neutrophils 46%, lymphocytes  10%, monocytes 5%, eosinophil 39%, basophil 0%, absolute neutrophils 11.7,  absolute lymphocytes 2.5, absolute monocytes 1.3, absolute eosinophils 9.9,  absolute basophils 0.0.  RBC morphology:  Polychromasia present.     Jillyn Ledger, M.D.             Madaline Guthrie, M.D.    CTW/MEDQ  D:  04/14/2003  T:  04/14/2003  Job:  [id number]

## 2011-02-08 NOTE — Op Note (Signed)
NAMENOBEL, BRAR NO.:  [id number]   MEDICAL RECORD NO.:  [id number]          PATIENT TYPE:  INP   LOCATION:  2931                         FACILITY:  [facility]   PHYSICIAN:  Doylene Canning. Ladona Ridgel, M.D.  DATE OF BIRTH:  Jul 17, 1930   DATE OF PROCEDURE:  06/18/2004  DATE OF DISCHARGE:                                 OPERATIVE REPORT   PROCEDURE PERFORMED:  Implantation of a dual chamber pacemaker.   INDICATIONS FOR PROCEDURE:  Complete heart block.   INTRODUCTION:  The patient is a 77 year old male with diabetes and  hypertension and a history of epilepsy and blindness who had a syncopal  episode and was found to be in third degree AV block.  He was admitted for  additional evaluation and treatment.  He is now referred for permanent  pacemaker insertion.   DESCRIPTION OF PROCEDURE:  After informed consent was obtained, the patient  was taken to the diagnostic electrophysiology laboratory in a fasting state.  After the usual preparation and draping, intravenous fentanyl and Midazolam  were given for sedation.  A total of 30 mL of lidocaine was infiltrated into  the left infraclavicular region. A 5 cm incision was carried out over this  region and electrocautery utilized to dissect down to the fascial plane.  The left subclavian vein was punctured times two.  The Medtronic model 5076  58 cm active fixation pacing lead serial number [id number] was advanced into  the right ventricle and the Medtronici 5076 52 cm active fixation pacing  lead serial  number [id number] advanced to the right atrium.  Mapping was  carried out in the right ventricle and at the final site near the right  ventricular apex.  The R-waves measured 56 mV and the ventricular threshold  once the lead was actively fixed was 0.7 V at 0.5 msec.  Pacing impedance  was 567 ohms.  10 V pacing did not stimulate the diaphragm.  With the  ventricular lead in satisfactory position, attention was then turned to  placement of the atrial lead.  It was placed in the right atrial appendage  where P-waves measured 4 mV and the atrial threshold was 0.7 V at 0.5 msec  with a pacing impedance of 643 ohms.  Again 10 V pacing did not stimulate  the diaphragm.  With both atrial and ventricular leads in satisfactory  position, they were secured to the subpectoralis fascia with a figure-of-  eight silk suture.  In addition, the sewing sleeve was secured with silk  suture.  The Medtronic sigma dual chamber pacemaker serial number [id number]  was connected to the atrial and ventricular pacing leads and placed in the  subcutaneous pocket.  Generator was secured with silk suture.  Electrocautery was utilized to make a subcutaneous pocket.  Kanamycin  irrigation was utilized to irrigate the pocket and electrocautery utilized  to ensure hemostasis.  The incision was then closed with a layer of 2-0  Vicryl, followed by a layer of 3-0 Vicryl followed by a layer of 4-0 Vicryl.  Benzoin was painted on the skin.  Steri-Strips were applied.  Pressure  dressing placed and the patient returned to his room in satisfactory  condition.   COMPLICATIONS:  There were no immediate procedure complications.   RESULTS:  This demonstrates successful insertion of a Medtronic dual chamber  pacemaker in a patient with complete heart block.       GWT/MEDQ  D:  06/18/2004  T:  06/18/2004  Job:  [id number]   cc:   Aundra Dubin. Revankar, M.D.  [address]  [place]  [place] [address]  Fax: [phone]

## 2011-02-08 NOTE — Discharge Summary (Signed)
NAME:  Andrew Quinn, Andrew Quinn                       ACCOUNT NO.:  [id number]   MEDICAL RECORD NO.:  [id number]                   PATIENT TYPE:  INP   LOCATION:  5715                                 FACILITY:  [facility]   PHYSICIAN:  Tawny Asal, MD             DATE OF BIRTH:  August 24, 1930   DATE OF ADMISSION:  03/30/2003  DATE OF DISCHARGE:  04/15/2003                                 DISCHARGE SUMMARY   ADDENDUM:  This is an addendum to stat discharge summary of April 14, 2003,  job number [id number].   ADDENDUM TO DISPOSITION:  Followup appointments have been made for Mr.  Andrew Quinn:  1. [facility] in Dr. Fayrene Fearing' clinic. He will see Andrew Quinn, P.A., on April 19, 2003 at 2:00 p.m. The address is [facility], [address], [place], [place] [address]. Phone     number:  [phone]. Fax is [phone].  2. [facility] to see Dr. Salvatore Decent. Address is:  [facility], [address] [place], [place] [address]. Phone number:  [phone]. Fax:  [phone]. This appointment has been made to see Dr.     Salvatore Decent on Friday, July 30, at 10:15. Andrew Quinn who is Andrew Givens     Quinn's health care power of attorney has been made aware of both of     these appointments. Andrew Quinn's home telephone number is [phone],     and his cell phone number is [phone], area code 336.   MEDICAL MANAGEMENT:  1. Rash. Primary care team should continue to treat with prednisone 80 mg     p.o. q.d. for six weeks and methotrexate 5 mg per week. Andrew Quinn rash     should improve on this medical regimen. If no improvement is noted, the     primary care team can increase the methotrexate dose by 2.5 mg per week     to a maximum of 12.5 mg per week. The plan should be to taper prednisone     until discontinuing as the rash resolves while on methotrexate. If there     is no change in the rash or the rash worsens after six weeks of  this     regimen, then obtain a dermatology consult to investigate using stronger     chemotherapeutic agents. IN addition, the primary care team should follow     Andrew Quinn clinically for signs of improvement and can also measure     eosinophil percent to gage improvement/resolution (e.g., could consider     starting to taper steroids when eosinophil level within normal limits).     Topical treatment of rash should include bathing with mild soap and water     and application of triamcinolone cream to lesions t.i.d. Particularly     painful or weeping lesions could be wrapped  loosely with a light gauze.     Consider treatment with antibiotics if lesions become purulent, show     signs of cellulitis, or other signs of infection (increased WBCs, fever).  2. Diabetes mellitus. The patient was on 4 units NPH b.i.d. and was getting     7 to 10 units of sliding scale insulin daily. We will discontinue sliding     scale insulin and add 3 units to NPH b.i.d. to get a total of 7 units NPH     b.i.d. It is critical that Andrew Quinn insulin is tapered down as he is     weaned off of prednisone to avoid hypoglycemia.  3. Prostate cancer. The patient will follow up with Dr. Salvatore Decent at [facility]     [facility]. Due for next Lupron and Casodex in September. Due for bone scan     in two to three months.   UPDATE DISCHARGE MEDICATIONS:  1. NPH insulin 7 units b.i.d.  2. Phenobarbital 100 mg p.o. q.h.s.  3. Phenobarbital 30 mg p.o. q.d.  4. Prednisone 80 mg p.o. q.d.  5. Senokot-S three tablets p.o. b.i.d.  6. Triamcinolone cream, one topical application to skin lesions t.i.d.  7. Dulcolax 5 mg suppository per rectum p.r.n. for constipation.  8. Benadryl 25 mg p.o. q.6h. p.r.n. for itching.   Please have this addendum to the discharge summary as well as the original  discharge summary which is job number [id number] mailed to Dr. Fayrene Fearing at [facility], [address], [place], [place]  [address] and another  copy to Dr. Salvatore Decent, [facility], [address], [place],  [place] [address].                                                Tawny Asal, MD    CW/MEDQ  D:  04/15/2003  T:  04/15/2003  Job:  [id number]   cc:   M.D. [facility],  [address]  [place], [place] [address]   [place]wal, M.D.  [facility],  [address]  [place], [place] [address]

## 2011-02-08 NOTE — Assessment & Plan Note (Signed)
[facility]                           ELECTROPHYSIOLOGY OFFICE NOTE   KESHAV, WINEGAR                      MRN:          [id number]  DATE:04/10/2006                            DOB:          July 24, 1930    Mr. Abreu was seen today in the clinic on April 10, 2006, for followup of  his Medtronic Model No. [id number] Sigma.  Date of implant was June 18, 2004,  for complete heart block.  On interrogation of his device today, his battery  voltage is 2.79.  P waves measures greater than 8 mV with an atrial capture  threshold of 0.5 V at 0.4 msec and an atrial lead impendence of 501.  R  waves were not measured.  He is pacemaker dependent to a rate of 30.  Ventricular pacing threshold was 0.5 V at 0.4 msec and a ventricular lead  impendence of 483.  There were 14 mode switch episodes and three ventricular  high rate episodes.  No changes were made in his parameters.  He will be  seen again in 6 months' time.                                   Altha Harm, LPN                                Doylene Canning. Ladona Ridgel, MD   PO/MedQ  DD:  04/10/2006  DT:  04/11/2006  Job #:  [id number]

## 2011-02-08 NOTE — Assessment & Plan Note (Signed)
[facility]                         ELECTROPHYSIOLOGY OFFICE NOTE   Andrew, Quinn                      MRN:          [id number]  DATE:11/06/2006                            DOB:          [birth year]    Mr. Andrew Quinn returns today for followup.  He is a very pleasant elderly  man with a history of stroke and complete heart block who lives in the  nursing facility.  He has congestive heart failure, insulin-dependent  diabetes, and coronary disease.  He denies chest pain or shortness of  breath.  He does complain of some swelling in his right leg.   EXAMINATION:  GENERAL:  He is a pleasant 77 year old man in no acute  distress.  VITAL SIGNS:  The blood pressure was 124/71, the pulse was 90 and  regular, respirations were 18, the weight was 238 pounds.  NECK:  Revealed no jugular venous distention.  LUNGS:  Clear bilaterally to auscultation.  CARDIOVASCULAR:  Revealed a regular rate and rhythm with normal S1 and  S2.  EXTREMITIES:  Demonstrated 2+ edema on the right leg, none on the left.  He had a bandage over the right foot.  The findings are consistent with  venous insufficiency.   Interrogation of his pacemaker demonstrates a Medtronic Sigma with P  waves greater than 2.  There were no R waves secondary to his complete  heart block.  The impedance 524 in the atrium, 528 in the ventricle.  The threshold 0.5 at 0.4 in both the right atrium and the right  ventricle.  The battery voltage was 2.78 volts.   IMPRESSION:  1. Symptomatic complete heart block.  2. Status post pacemaker insertion.  3. Ischemic heart disease.  4. Diabetes.   DISCUSSION:  Overall, Andrew Quinn is stable.  We will not make any  changes in his medical therapy today.  I have recommended that he keep  his leg elevated as much as possible.     Doylene Canning. Ladona Ridgel, MD  Electronically Signed    GWT/MedQ  DD: 11/06/2006  DT: 11/06/2006  Job #: [id number]   cc:   [facility] facility

## 2011-02-08 NOTE — H&P (Signed)
Andrew Andrew Quinn, Andrew Quinn NO.:  [id number]   MEDICAL RECORD NO.:  [id number]          PATIENT TYPE:  INP   LOCATION:  2931                         FACILITY:  [facility]   PHYSICIAN:  Caprice Beaver, MD       DATE OF BIRTH:  07/02/1930   DATE OF ADMISSION:  06/17/2004  DATE OF DISCHARGE:                                HISTORY & PHYSICAL   CHIEF COMPLAINT:  Chest pain and shortness of breath.   HISTORY OF PRESENT ILLNESS:  Andrew Andrew Quinn Andrew Quinn is a 77 year old gentleman who is a  resident at [facility] that presented to [facility] with  a chief complaint of chest pain and shortness of breath x1 day.  He had an  episode earlier today where he nearly passed out, with chest pain and  difficulty breathing.  Of note, he has a past medical history of diabetes  mellitus type 2, hypertension, epilepsy, blindness but no significant known  coronary artery disease, previous MI or cardiovascular surgery.  At [facility] he was diagnosed on EKG with a third degree AV block and presents  here for transfer for definitive treatment with permanent pacemaker  placement.  He denies any nausea, vomiting, diarrhea, fevers, chills, night  sweats or chest pain at this time.  He does note a history of occasional  episodes of chest pain that is not exertional in nature.   MEDICATIONS:  1.  Imdur 30 mg p.o. daily.  2.  Glucotrol XL 10 mg p.o. daily.  3.  Phenobarbital 45 mg p.o. b.i.d.  4.  Lisinopril 10 mg p.o. daily.  5.  Glucophage 1000 mg p.o. b.i.d.  6.  Ferrous sulfate 325 mg p.o. b.i.d.  7.  Colace 100 mg p.o. b.i.d.  8.  Prilosec 40 mg p.o. daily.  9.  Aspirin 81 mg p.o. daily.  10. Senna 3 tablets p.o. b.i.d.  11. Os-Cal 1 tablet p.o. daily.  12. Novolin Insulin 5 units SQ q.a.m. b.i.d.  13. Lasix 20 mg p.o. daily.   ALLERGIES:  No known drug allergies.   PAST MEDICAL HISTORY:  1.  Diabetes mellitus type 2.  Hemoglobin A1C was 6.6 in January, 2005.  2.   Hypertension.  3.  Seizure disorder.  4.  Blindness.  5.  Status post left hip hemiarthroplasty.  6.  Prostate cancer.   SOCIAL HISTORY:  Andrew Andrew Quinn Andrew Quinn lives at [facility], is not a  smoker, denies any alcohol use or illicit drug use.  He has a first grade  education and has 1 living brother and sister.   FAMILY HISTORY:  Notable for mother with death secondary to a myocardial  infarction.   REVIEW OF SYSTEMS:  No fevers, chills, diarrhea, constipation, nausea,  vomiting, change in urinary frequency, dysuria, urgency, dyspnea on  exertion, paroxysmal nocturnal dyspnea, weakness, numbness.   PHYSICAL EXAMINATION:  VITAL SIGNS:  Afebrile, pulse 30 to 40, blood  pressure 130 to 150 over 50 to 60. Respirations are 16, O2 saturations are  95% on room air 4 liters nasal cannula.  GENERAL:  He is in no acute  distress.  Alert and oriented to person, place  and season but not date, month or year.  HEENT:  Normocephalic, atraumatic.  Pupils are equal, round and reactive to  light and accommodation but unable to track movements.  Extraocular  movements are intact with limited testing.  There is no JVD but positive  cannon a waves, no thyromegaly.  LUNGS:  Clear to auscultation bilaterally. No wheezes, rales or rhonchi.  CARDIAC EXAM:  There is bradycardia with pulse of varying intensities, no  murmurs, rubs, or gallops.  ABDOMEN:  Positive bowel sounds, obese, nontender, slightly distended but  without guarding or rebound.  EXTREMITIES:  There is 1+ peripheral edema bilaterally. Dorsalis pedis 2+  bilaterally.  NEUROLOGIC EXAM:  Moves all extremities well with refluxes 2+ bilaterally  and normal sensation throughout.  Cranial nerves III-XII grossly intact.   LABORATORY DATA:  Sodium 148, potassium 4.0, chloride 113, bicarb 21, BUN  35, creatinine 1.1, glucose 202.  White blood cell count 7.4 thousand,  hemoglobin 10.7, hematocrit 30.6, platelets 249,000.  AST 44, ALT 113,   alkaline phosphatase 61, total bilirubin 0.19, albumin 3.1,  ABG:  7.45/30/72/20.9.  BNP 1460.  CPK 77, troponin I 0.08, phenobarb level  is 19.9.   EKG demonstrated third degree AV block with right bundle branch block and  left anterior fascicular block.   ASSESSMENT AND PLAN:  This is a 77 year old gentleman with a known past  medical history of diabetes mellitus, hypertension, blindness and a seizure  disorder.  1.  Cardiovascular. His EKG is consistent with third degree atrioventricular      block and right bundle branch block.  He was unsuccessfully treated with      atropine in the emergency department at [facility] but his      symptoms resolved.  However his pulse remained in the range of 30 to 40.      He will be admitted to the CCU here and monitored closely.  Pacemaker      leads were placed and we are able to capture his beat, since he is      asymptomatic at this time there is no need to pace.  If he becomes      symptomatic we will pace him transcutaneously until definitive treatment      with a permanent pacemaker can be carried out.  We will also cycle      cardiac enzymes to rule out ischemia/infarct and check a  2D echocardiogram tomorrow to evaluate for any structural abnormalities as  well as ejection fraction.  Will resume his nursing home dose of Lisinopril  and aspirin but will hold his Lasix and Imdur for now.  Will check a  hemoglobin A1C in the morning to assess his diabetes control as well as a  fasting lipid panel.  He may need to be started on Statin depending on the  results.  BNP is elevated at 1460 but he has no crackles on physical exam  and will follow closely with daily weights and strict I&O's and resume  Lasix.   1.  History of seizure disorder. Will continue his Phenobarbital at the      current dose.  He was therapeutic when checked in [facility] emergency      department.   1.  Diabetes mellitus type 2.  Start on sliding scale Insulin and  hold his     oral regimen for now.  Do Accu-Chek's q.a.c. and h.s. and check a  hemoglobin A1C.   1.  Increased ALT, this isolated with a normal AST level, alkaline      phosphatase and total bilirubin. Questionable whether this is associated      with congestive heart failure or viral etiology, diabetes mellitus,      hyperlipidemia. Will recheck his liver function tests and evaluate      further as necessary with possible hepatitis serologies.   1.  Deep venous thrombosis prophylaxis.  We will start him on Lovenox 40 mg      SQ daily.   1.  GI prophylaxis/history of peptic ulcer disease.  Will treat him with      Protonix 40 mg daily.       BM/MEDQ  D:  06/17/2004  T:  06/18/2004  Job:  [id number]

## 2011-02-08 NOTE — Consult Note (Signed)
[place]. [facility]  Patient:    Andrew Quinn, Andrew Quinn                      MRN: [id number] Attending:  Meade Maw, M.D.                          Consultation Report  REASON FOR CONSULTATION:  Chest pain.  HISTORY:  Andrew Quinn is a 77 year old gentleman with past medical history of mental retardation who resides in a nursing home noted to have chest pain following lunch and was brought to the emergency room for further evaluation. Upon arrival to the emergency room, his chest pain had resolved.  PAST MEDICAL HISTORY:  Significant for diabetes mellitus type II, hypertension.  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  The chest pain was associated with diaphoresis.  REVIEW OF SYSTEMS:  Unavailable secondary to the patients poor history, but according to charts he has had no hemoptysis, no bright red blood from rectum and no melena at the nursing home.  Similar episode of chest pain approximately one year ago.  CURRENT MEDICATIONS:  Include aspirin daily, Phenobarb 30 mg p.o. b.i.d., Dilantin 100 mg two tablets in the morning and three in the afternoon, Glucotrol XL 2 mg p.o. q.d., Zestril 10 mg p.o. q.d., docusate 100 mg p.o. q.d., ______ one q.d., Darvocet p.r.n.  ALLERGIES:  No known drug allergies.  FAMILY HISTORY:  Father died from diabetes.  Mother died from unknown heart disease.  SOCIAL HISTORY:  No history of alcohol, tobacco or drugs.  PHYSICAL EXAMINATION:  GENERAL:  Reveals an elderly black male in no acute distress, very slow historian.  VITAL SIGNS:  On arrival, blood pressure 136/76, heart rate 80.  He was afebrile.  His O2 saturation was 100% on room air.  HEENT:  Unremarkable.  No neck vein distention.  No carotid bruits.  His thyroid is nonpalpable.  HEART:  Heart sounds are distant.  Regular rate and rhythm.  No rubs, murmurs or gallops appreciated.  PULMONARY:  Reveals breath sounds which are  equal and clear to auscultation.  ABDOMEN:  Obese, normal bowel sounds.  EXTREMITIES:  Reveal distal pulses which are equal and palpable.  There is no peripheral edema noted.  NEUROLOGIC:  Reveals severe nystagmus, blindness, unable to evaluate for gait.  LABORATORY DATA:  His white count 10.6, hemoglobin 13.6, ______ 4.1, creatinine 1.0, glucose 233, CK 144, MB 3.8, relative index 2.6, troponin I is less than 0.03.  Dilantin is 16.4, Phenobarb 12.8, coagulants are normal.  Chest x-ray:  Reveals no acute disease.  ECG:  Reveals a right bundle branch block with a first-degree AV block.  IMPRESSION:  Chest pain with significant risk factors including family history, hypertension and diabetes.  The patient was also noted to have bright red blood per rectum during exam.  In view of this, would hold off on anticoagulation and aspirin.  Adenosine cardiolite will be planned if family wishes for aggressive therapy.  Would hold on aspirin at this point. DD:  03/18/00 TD:  03/18/00 Job: [id number]

## 2011-02-08 NOTE — Discharge Summary (Signed)
Andrew Quinn, Andrew Quinn NO.:  [id number]   MEDICAL RECORD NO.:  [id number]          PATIENT TYPE:  INP   LOCATION:  2931                         FACILITY:  [facility]   PHYSICIAN:  Caprice Beaver, MD       DATE OF BIRTH:  Feb 23, 1930   DATE OF ADMISSION:  06/17/2004  DATE OF DISCHARGE:  06/19/2004                           DISCHARGE SUMMARY - REFERRING   DISCHARGE DIAGNOSES:  1.  Three-degree atrioventricular block, now status post a dual-chamber      pacemaker placement and rate control.  2.  Diabetes mellitus, with hemoglobin A1c 7.2.  3.  Past medical history of hypertension.  4.  Seizure disorder.  5.  Blindness.  6.  Status post left hip hemi-arthroplasty.  7.  History of prostate cancer.  8.  Chronic venous insufficiency with left lower extremity stasis ulcer.  9.  Hematuria.   DISCHARGE MEDICATIONS:  1.  Imdur 30 mg p.o. daily.  2.  Glucotrol XL 10 mg p.o. b.i.d.  3.  Phenobarbital 45 mg p.o. b.i.d.  4.  Lisinopril 10 mg p.o. daily.  5.  Glucophage 1000 mg p.o. b.i.d.  6.  Ferrous sulfate 325 mg p.o. b.i.d.  7.  Colace 100 mg p.o. b.i.d.  8.  Prilosec 40 mg p.o. daily.  9.  Aspirin 81 mg p.o. daily.  10. Senna, three tab p.o. b.i.d.  11. Os-Cal, one tab p.o. daily.  12. Novolin 5 units b.i.d.  13. Lasix 20 mg p.o. daily.   CONDITION ON DISCHARGE:  Much improved.   FOLLOWUP:  The patient will follow up with the physician at [facility], as well as with a cardiology appointment that has  been scheduled some time in the next week.  Follow up the basic metabolic  panel, needs to be checked to ensure that the potassium is normal.   PROCEDURE:  Placement of a Medtronic dual-chamber pacemaker.   HISTORY OF PRESENT ILLNESS:  The patient is a 77 year old gentleman with a  past medical history of diabetes, hypertension, seizure disorder and  blindness, who presented as a transfer from [facility] where he was  diagnosed with a  third-degree AV block.  He presented there by EMS with  chest pain and dyspnea that started earlier in the day on June 17, 2004.  I note that he almost passed out standing, but denied losing  consciousness.  He had previous episodes which were similar, but not as  severe.   PHYSICAL EXAMINATION:  VITAL SIGNS:  Afebrile with a pulse still in the 30s,  blood pressure 130-140/50-60's, breathing at 16 times per minute, and was  saturating at 96% on 2 L nasal cannula.  GENERAL:  He was in no acute distress, alert and oriented to person, place  and season, but not the date, month, or year.  HEENT:  Normocephalic and atraumatic.  Pupils equal, round, reactive to  light.  Unable to track the movements.  Extraocular movements intact with  limited testing.  NECK:  There is no jugular venous distention present, but  the neck examination is positive for Cannon A-waves.  No thyromegaly.  LUNGS:  Clear to auscultation bilaterally.  HEART:  Bradycardia, pulse varies in intensity, but no murmurs, rubs, or  gallops.  ABDOMEN:  Positive bowel sounds, obese, nontender.  Slightly distended, but  there is no guarding or rebound.  EXTREMITIES:  There was 2+ peripheral edema bilaterally.  Dorsalis pedis was  2+.  He had compression stockings in place. There was a left lower extremity  venous stasis ulcer.  NEUROLOGIC:  He moved all extremities well.  Reflexes were 2+ bilaterally.  Normal sensation throughout.  Cranial nerves III-XII  were intact grossly.   LABORATORY DATA:  Sodium 148, potassium 4.0, chloride 113, bicarbonate 21,  BUN 35, creatinine 1.1, glucose 202.  Hemoglobin 10.7, white blood cell  count 7.4, platelets 249.  Beta natriuretic peptide was 1460.  ALT elevated  at 113, AST 44, albumin 3.1.  Electrocardiogram:  With a third-degree AV block with a flow rate in the  70's, a ventricular rate in the 30's.  There is also a right bundle branch  block.   HOSPITAL COURSE:  #1 - THIRD-DEGREE  AV BLOCK:  The patient is  hemodynamically stable despite his rate of 32.  He was admitted and leads  were placed transcutaneously with adequate capture beats; however, he was  not paced, given the discomfort and his stable presentation.  He was  scheduled for a pacemaker implantation, which was performed on day two of  admission.  He also received a 2-D echocardiogram on hospital day number two  which revealed an ejection fraction of 55%-65%.  Cardiac enzymes were cycled  and were negative for acute ischemia.  He did have slightly elevated  troponin of 0.08.  This could be secondary to distention of the chambers of  the heart and is consistent with his beta natriuretic peptide of 1460.  He  was restarted on his Lisinopril and aspirin, but his Lasix was held, as well  as his Imdur.  These were restarted prior to discharge, and he was given  several doses of 40 mg intravenously of Lasix.  Subsequent to this, his  potassium dropped to 3.3, and was repleted prior to his discharge.  This  will need to be followed up once he is back to [facility], to ensure that he  is not hypokalemic.  #2 - HEMATURIA:  The patient denied any genitourinary symptoms on admission;  however, after a Foley was placed on hospital day two, prior to the  implantation of the pacemaker, he developed bright red output in the Foley  bag.  PT and PTT were checked.  The PT was normal at 13.8 and PTT was also  normal at 30.  His platelet count was 226.  He did report some stinging in  the area of the Foley.  Given his history of Foley insertion as well as  administration of Lovenox, most likely this was a traumatic insertion of the  Foley.  At the time of discharge a urinalysis is pending.  It is unlikely  that the patient has bladder cancer or renal pathology.  He will need a  follow-up urinalysis once he is back at [facility], to assess any potential hematuria.  His creatinine at the time of discharge is 1.2.  #3 - INCREASED  ALT:  This is an isolated finding.  He denied any abdominal  pain; however, was slightly tender in the right upper quadrant.  This could  have been secondary to acute congestive hepatopathy due to the symptoms of  failure  from a low cardiac output.  It is doubtful that there is any viral  hepatitis contributing here.  He may need repeat liver function tests in the  future to evaluate this further.  #4 - HISTORY OF DIABETES:  The patient's glucose was 202 on admission;  however, he was well-controlled throughout admission and was restarted on  his home regimen at transfer back to [facility].  Hemoglobin A1c was checked  and was 7.2, demonstrating good control of his diabetes for the past three  months.  #5 - HISTORY OF SEIZURE DISORDER:  The patient was therapeutic on his  phenobarbital on admission, and was continued on this regimen.   DISCHARGE LABORATORY DATA:  Sodium 142, potassium 3.3, chloride 107,  bicarbonate 23, BUN 20, creatinine 1.2, glucose 128.  White blood cells 8,  hemoglobin 10.7, hematocrit 31.1, platelets 226.  Beta natriuretic peptide  352.  Hemoccult negative.  PT 13.8, PT 30.       BM/MEDQ  D:  06/19/2004  T:  06/19/2004  Job:  [id number]   cc:   Donnel Saxon, M.D.  [facility]

## 2011-05-23 ENCOUNTER — Encounter: Payer: Self-pay | Admitting: Internal Medicine

## 2011-05-24 ENCOUNTER — Encounter: Payer: Medicare Other | Admitting: Internal Medicine

## 2011-05-28 ENCOUNTER — Encounter: Payer: Medicare Other | Admitting: Internal Medicine

## 2011-12-18 ENCOUNTER — Telehealth: Payer: Self-pay | Admitting: Internal Medicine

## 2011-12-18 ENCOUNTER — Encounter: Payer: Self-pay | Admitting: Internal Medicine

## 2011-12-18 NOTE — Telephone Encounter (Signed)
12-15-11 SENT PATIENT PAST DUE LETTER/MT

## 2012-02-18 ENCOUNTER — Encounter: Payer: Self-pay | Admitting: *Deleted

## 2012-05-19 ENCOUNTER — Encounter: Payer: Self-pay | Admitting: *Deleted

## 2012-05-26 ENCOUNTER — Encounter: Payer: Self-pay | Admitting: Internal Medicine

## 2012-05-26 ENCOUNTER — Ambulatory Visit (INDEPENDENT_AMBULATORY_CARE_PROVIDER_SITE_OTHER): Payer: Medicare Other | Admitting: Internal Medicine

## 2012-05-26 VITALS — BP 129/63 | HR 68

## 2012-05-26 DIAGNOSIS — I442 Atrioventricular block, complete: Secondary | ICD-10-CM

## 2012-05-26 DIAGNOSIS — Z950 Presence of cardiac pacemaker: Secondary | ICD-10-CM

## 2012-05-26 LAB — PACEMAKER DEVICE OBSERVATION
ATRIAL PACING PM: 15.1
BAMS-0001: 150 {beats}/min
RV LEAD IMPEDENCE PM: 440 Ohm
VENTRICULAR PACING PM: 99.9

## 2012-05-26 NOTE — Assessment & Plan Note (Addendum)
The patient's device was interrogated.  The information was reviewed. No changes were made in the programming.     0 his primary care physician is in [place]. I have asked that they consider having his pacemaker follow up in [place] to facilitate his care. We are glad to help in whatever way we can

## 2012-05-26 NOTE — Progress Notes (Signed)
Patient has no care team.   HPI  Andrew Quinn is a 78 y.o. male Seen for Dr. Ladona Ridgel. He has a history of a pacemaker previously implanted for complete heart block. He has dementia.  Denise sob and cp  Past Medical History  Diagnosis Date  . Complete heart block   . Diastolic CHF, chronic   . HTN (hypertension)   . Ischemic heart disease   . DM (diabetes mellitus)   . Prostate cancer   . Arthritis   . Blindness     Past Surgical History  Procedure Date  . Pacemaker insertion 05/2004    Current Outpatient Prescriptions  Medication Sig Dispense Refill  . acetaminophen (TYLENOL) 325 MG tablet Take 650 mg by mouth every 6 (six) hours as needed.        . ALPRAZolam (XANAX) 0.5 MG tablet Take 0.5 mg by mouth every 6 (six) hours as needed.        Marland Kitchen aspirin 81 MG tablet Take 81 mg by mouth daily.        . Calcium Carbonate-Vitamin D (CALCIUM-VITAMIN D) 500-200 MG-UNIT per tablet Take 1 tablet by mouth 2 (two) times daily with a meal.        . docusate sodium (COLACE) 100 MG capsule Take 100 mg by mouth 2 (two) times daily.        . Emollient (EUCERIN PLUS) 5-5 % LOTN Apply topically 2 (two) times daily.        . ferrous fumarate-iron polysaccharide complex (TANDEM) 162-115.2 MG CAPS Take 1 capsule by mouth 2 (two) times daily.        Marland Kitchen lisinopril (PRINIVIL,ZESTRIL) 2.5 MG tablet Take 2.5 mg by mouth daily.        . metFORMIN (GLUCOPHAGE) 500 MG tablet Take 500 mg by mouth daily with breakfast.        . mirtazapine (REMERON) 7.5 MG tablet Take 7.5 mg by mouth at bedtime.        . nitroGLYCERIN (NITROSTAT) 0.4 MG SL tablet Place 0.4 mg under the tongue every 5 (five) minutes as needed.        Marland Kitchen oxyCODONE-acetaminophen (PERCOCET) 5-325 MG per tablet Take 1 tablet by mouth every 4 (four) hours as needed.        Marland Kitchen PHENObarbital (LUMINAL) 60 MG tablet Take 60 mg by mouth 2 (two) times daily.          Allergies not on file  Review of Systems negative except from HPI and  PMH  Physical Exam BP 129/63  Pulse 68 Well developed and well nourished in no acute distress HENT loss of left eye  E scleral and icterus clear Neck Supple JVP flat; carotids brisk and full Clear to ausculation Device pocket well healed; without hematoma or erythema regular rate and rhythm,  Soft with active bowel sounds No clubbing cyanosis  Legs wrapped bilaterally  Alert , in a wheel chair Skin Warm and Dry    Assessment and  Plan

## 2012-05-26 NOTE — Assessment & Plan Note (Signed)
Stable post pacing 

## 2012-05-26 NOTE — Patient Instructions (Signed)
Your physician wants you to follow-up in: 6 months with device clinic and 12 months with Dr Klein.  You will receive a reminder letter in the mail two months in advance. If you don't receive a letter, please call our office to schedule the follow-up appointment.  

## 2013-06-23 DEATH — deceased

## 2013-06-29 ENCOUNTER — Encounter: Payer: Self-pay | Admitting: *Deleted

## 2014-03-09 ENCOUNTER — Telehealth: Payer: Self-pay

## 2014-03-09 NOTE — Telephone Encounter (Signed)
Patient past away verified with [place]
